# Patient Record
Sex: Male | Born: 2000 | Race: White | Hispanic: No | Marital: Single | Smoking: Never smoker
Health system: Southern US, Community
[De-identification: ages and names within clinical notes are randomized; demographics above are authoritative.]

## PROBLEM LIST (undated history)

## (undated) DIAGNOSIS — G43909 Migraine, unspecified, not intractable, without status migrainosus: Secondary | ICD-10-CM

---
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
## 2020-06-17 IMAGING — CR DG ANKLE COMPLETE 3+V*L*
1 series · 3 of 3 positions shown · non-contrast
Comparison: None.

CLINICAL DATA: Left ankle injury

EXAM:
LEFT ANKLE COMPLETE - 3+ VIEW

[Series 1: dg ankle complete left · 0.14mm/px · 3 of 3 slices shown]
[im 1/3]
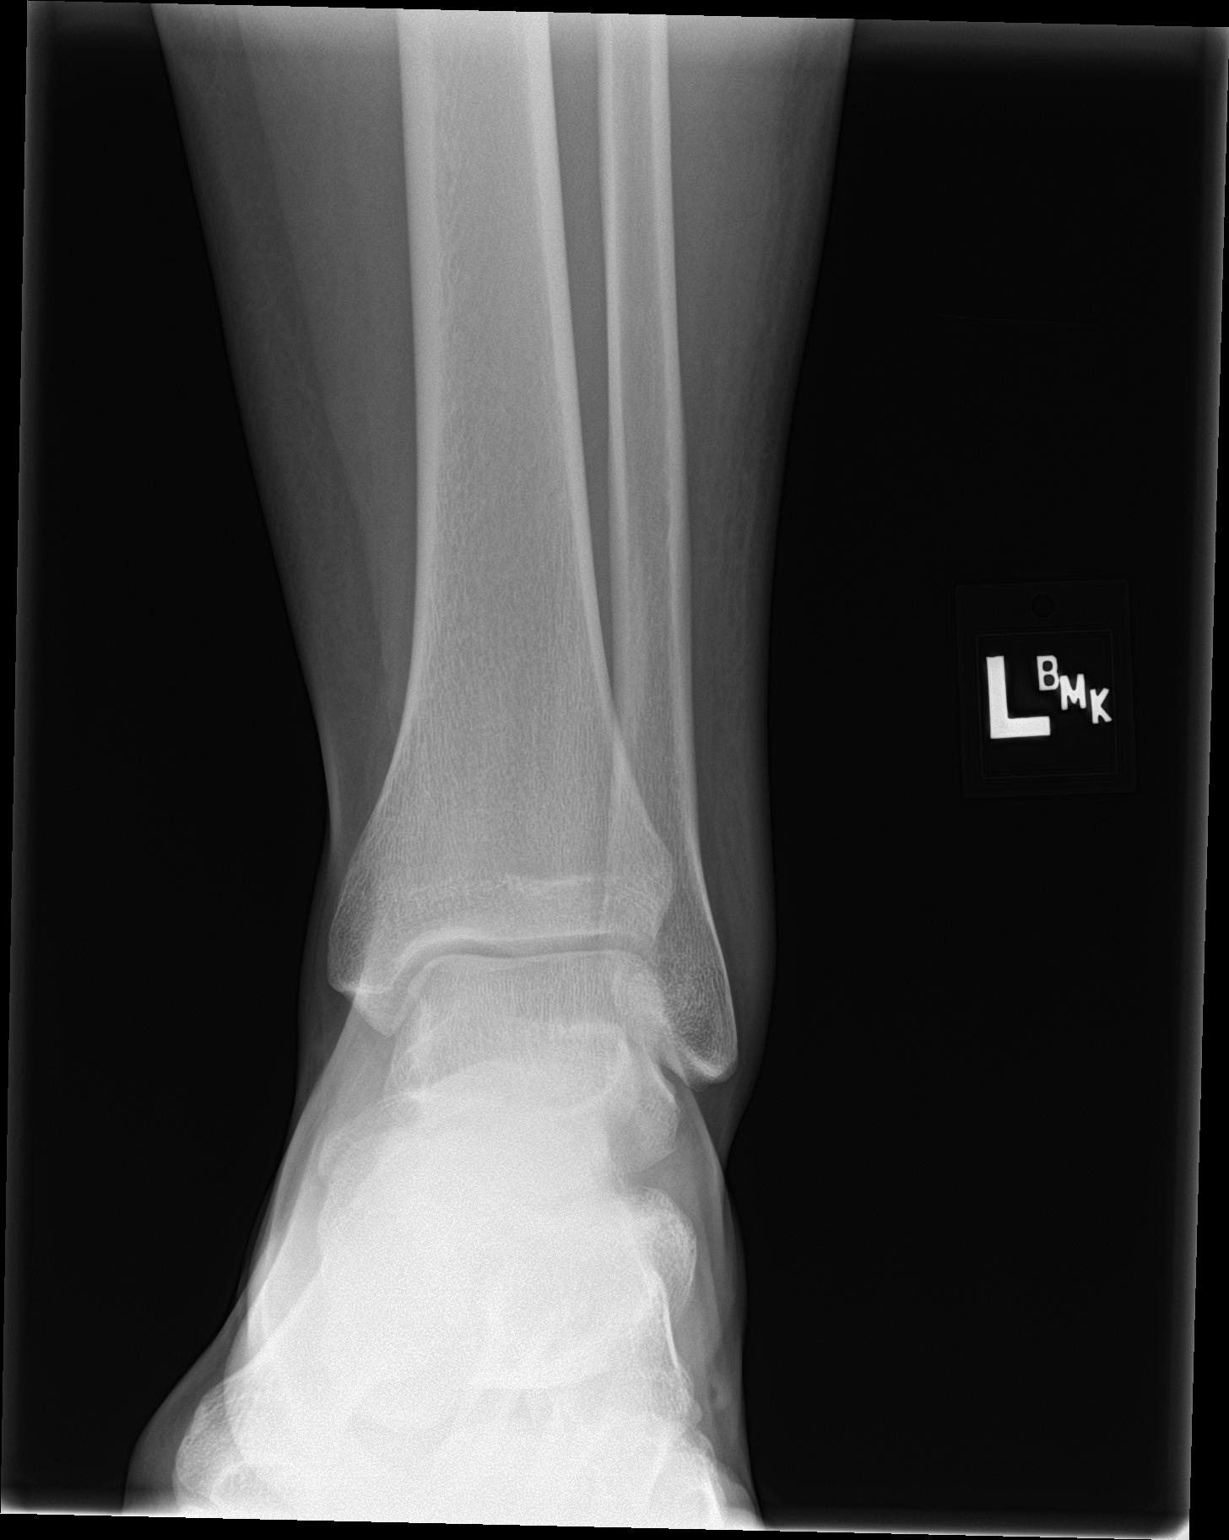
[im 2/3]
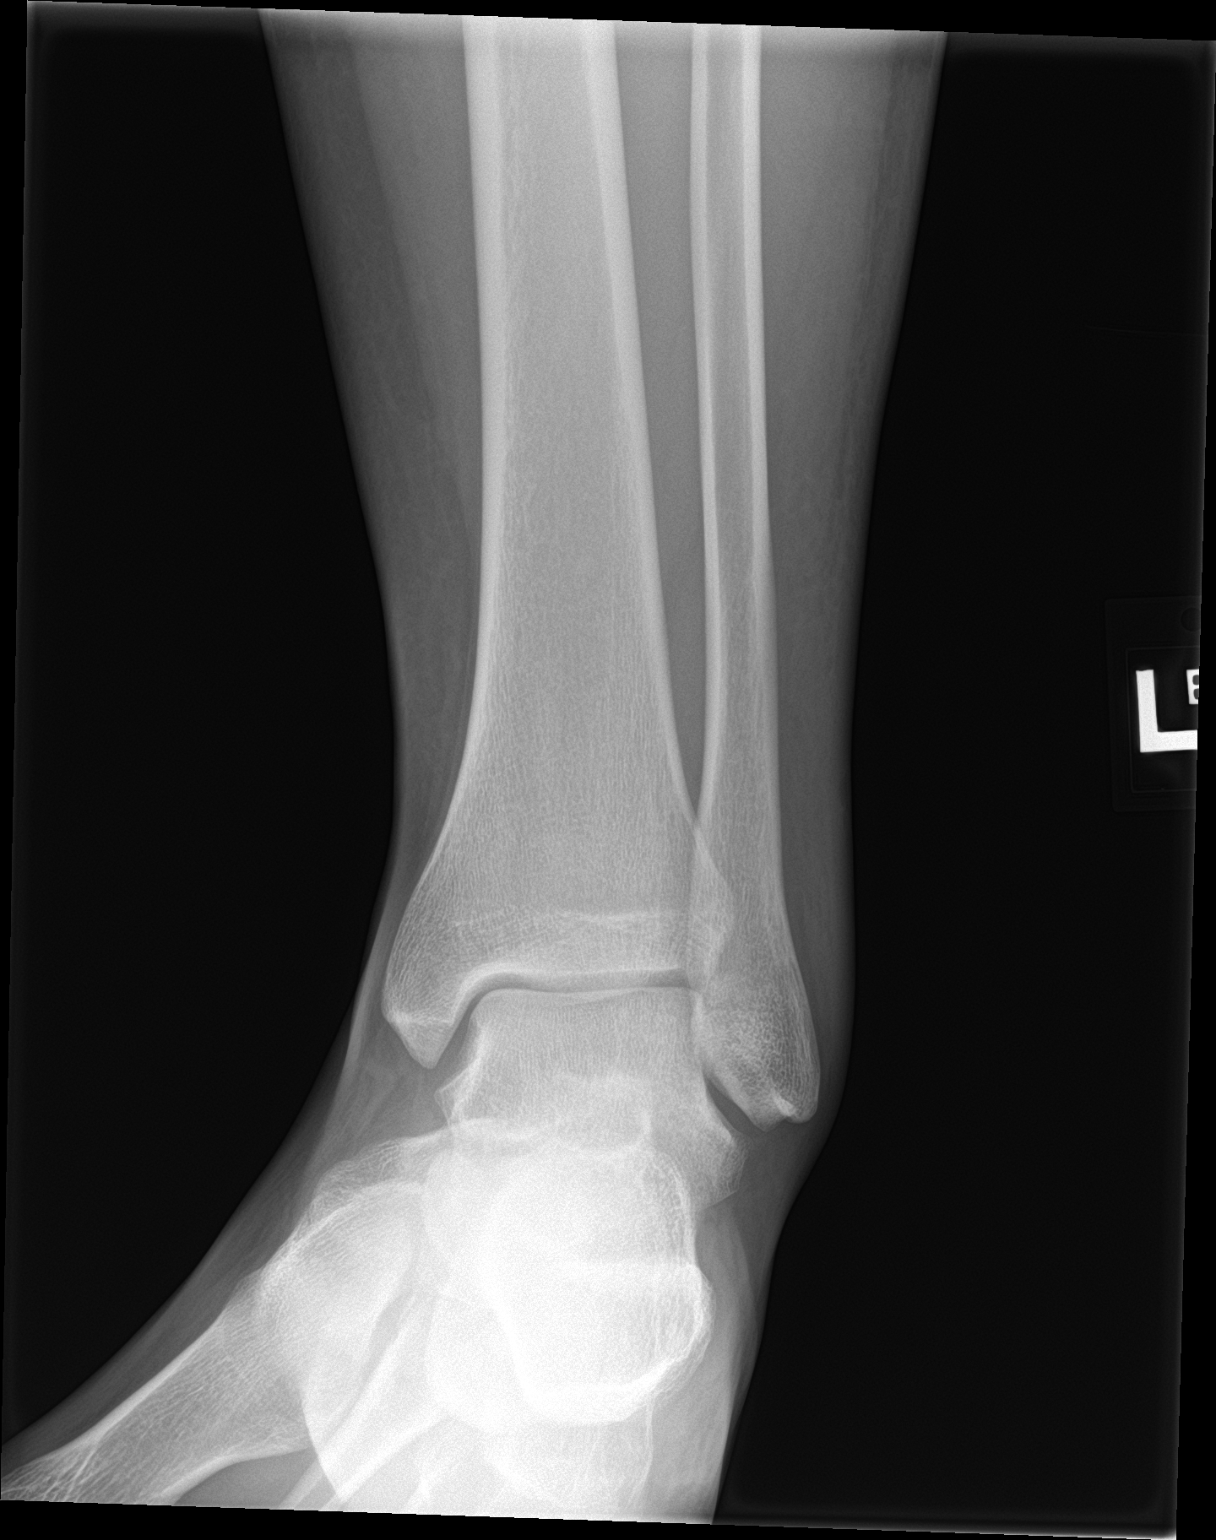
[im 3/3]
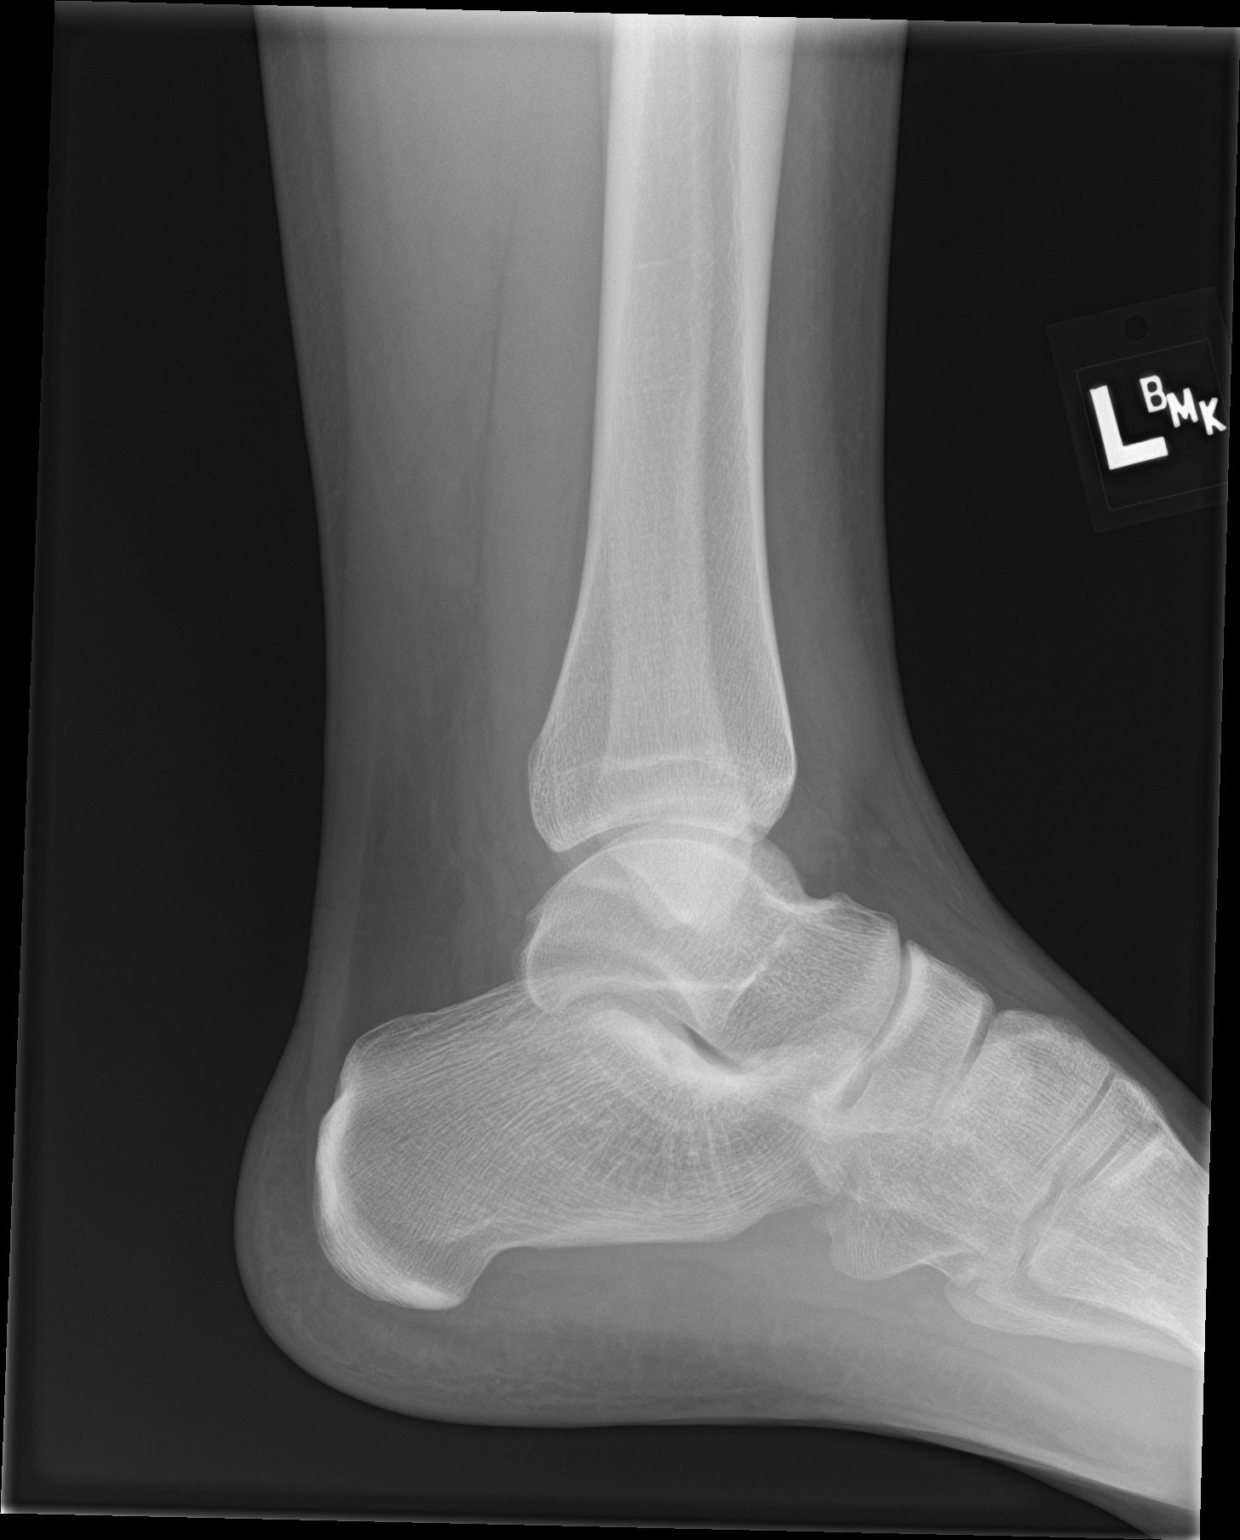

[3 of 3 positions shown; findings below may reference images not displayed]

FINDINGS: There is no evidence of fracture, dislocation, or joint effusion.
There is no evidence of arthropathy or other focal bone abnormality.
Soft tissues are unremarkable.
IMPRESSION: Negative.

## 2023-11-23 ENCOUNTER — Emergency Department

## 2023-11-23 ENCOUNTER — Emergency Department
Admission: EM | Admit: 2023-11-23 | Discharge: 2023-11-23 | Disposition: A | Discharge status: Home / Self Care | Attending: Emergency Medicine | Admitting: Emergency Medicine

## 2023-11-23 ENCOUNTER — Encounter: Payer: Self-pay | Admitting: Emergency Medicine

## 2023-11-23 ENCOUNTER — Other Ambulatory Visit: Payer: Self-pay

## 2023-11-23 DIAGNOSIS — S161XXA Strain of muscle, fascia and tendon at neck level, initial encounter: Secondary | ICD-10-CM | POA: Diagnosis not present

## 2023-11-23 DIAGNOSIS — S39012A Strain of muscle, fascia and tendon of lower back, initial encounter: Secondary | ICD-10-CM | POA: Diagnosis not present

## 2023-11-23 DIAGNOSIS — S199XXA Unspecified injury of neck, initial encounter: Secondary | ICD-10-CM | POA: Diagnosis present

## 2023-11-23 DIAGNOSIS — Y92410 Unspecified street and highway as the place of occurrence of the external cause: Secondary | ICD-10-CM | POA: Insufficient documentation

## 2023-11-23 MED ORDER — OXYCODONE HCL 5 MG PO TABS
5.0000 mg | ORAL_TABLET | Freq: Once | ORAL | Status: AC
  Administered 2023-11-23: 5 mg via ORAL
  Filled 2023-11-23: qty 1

## 2023-11-23 MED ORDER — HYDROCODONE-ACETAMINOPHEN 5-325 MG PO TABS: 1.0000 | ORAL_TABLET | Freq: Four times a day (QID) | ORAL | 0 refills | Status: AC | PRN

## 2023-11-23 MED ORDER — METHOCARBAMOL 500 MG PO TABS: 500.0000 mg | ORAL_TABLET | Freq: Four times a day (QID) | ORAL | 0 refills | Status: AC | PRN

## 2023-11-23 NOTE — Discharge Instructions (Signed)
 Follow-up with your primary care provider or return to the emergency department over the weekend if any severe worsening of your symptoms or urgent concerns.  A prescription for pain medication and muscle relaxant was sent to your pharmacy.  Take these only as prescribed and be aware that they both can cause drowsiness and should not be taken if you are driving or operating machinery.  You may use ice or heat to your back and muscles as needed for discomfort.
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
